# Patient Record
Sex: Female | Born: 1990 | Race: White | Hispanic: No | Marital: Single
Health system: Southern US, Community
[De-identification: ages and names within clinical notes are randomized; demographics above are authoritative.]

## PROBLEM LIST (undated history)

## (undated) ENCOUNTER — Emergency Department (HOSPITAL_COMMUNITY): Discharge status: Absent without leave | Payer: Commercial Managed Care - PPO

## (undated) DIAGNOSIS — F410 Panic disorder [episodic paroxysmal anxiety] without agoraphobia: Secondary | ICD-10-CM

---

## 2012-11-27 ENCOUNTER — Other Ambulatory Visit: Payer: Self-pay

## 2012-11-27 ENCOUNTER — Other Ambulatory Visit: Payer: Self-pay | Admitting: Gastroenterology

## 2012-11-27 ENCOUNTER — Ambulatory Visit
Admission: RE | Admit: 2012-11-27 | Discharge: 2012-11-27 | Disposition: A | Discharge status: Home / Self Care | Payer: Commercial Managed Care - PPO | Source: Ambulatory Visit | Attending: Gastroenterology | Admitting: Gastroenterology

## 2012-11-27 DIAGNOSIS — R1310 Dysphagia, unspecified: Secondary | ICD-10-CM

## 2013-05-11 ENCOUNTER — Inpatient Hospital Stay (HOSPITAL_COMMUNITY)
Admission: AD | Admit: 2013-05-11 | Discharge: 2013-05-11 | Discharge status: Against Medical Advice | Payer: Commercial Managed Care - PPO | Source: Ambulatory Visit | Attending: Obstetrics and Gynecology | Admitting: Obstetrics and Gynecology

## 2019-06-30 ENCOUNTER — Other Ambulatory Visit: Payer: Self-pay

## 2019-06-30 DIAGNOSIS — Z20822 Contact with and (suspected) exposure to covid-19: Secondary | ICD-10-CM

## 2019-07-02 LAB — NOVEL CORONAVIRUS, NAA: SARS-CoV-2, NAA: NOT DETECTED

## 2019-08-17 ENCOUNTER — Other Ambulatory Visit: Payer: Self-pay

## 2019-08-17 DIAGNOSIS — Z20822 Contact with and (suspected) exposure to covid-19: Secondary | ICD-10-CM

## 2019-08-18 LAB — NOVEL CORONAVIRUS, NAA: SARS-CoV-2, NAA: NOT DETECTED

## 2020-04-30 ENCOUNTER — Other Ambulatory Visit: Payer: Self-pay

## 2020-04-30 ENCOUNTER — Emergency Department (HOSPITAL_COMMUNITY)
Admission: EM | Admit: 2020-04-30 | Discharge: 2020-05-01 | Disposition: A | Discharge status: Home / Self Care | Payer: Self-pay | Attending: Emergency Medicine | Admitting: Emergency Medicine

## 2020-04-30 ENCOUNTER — Encounter (HOSPITAL_COMMUNITY): Payer: Self-pay | Admitting: Emergency Medicine

## 2020-04-30 ENCOUNTER — Emergency Department (HOSPITAL_COMMUNITY): Payer: Self-pay

## 2020-04-30 DIAGNOSIS — R071 Chest pain on breathing: Secondary | ICD-10-CM | POA: Insufficient documentation

## 2020-04-30 DIAGNOSIS — Z5321 Procedure and treatment not carried out due to patient leaving prior to being seen by health care provider: Secondary | ICD-10-CM | POA: Insufficient documentation

## 2020-04-30 DIAGNOSIS — M25512 Pain in left shoulder: Secondary | ICD-10-CM | POA: Insufficient documentation

## 2020-04-30 DIAGNOSIS — F410 Panic disorder [episodic paroxysmal anxiety] without agoraphobia: Secondary | ICD-10-CM | POA: Insufficient documentation

## 2020-04-30 HISTORY — DX: Panic disorder (episodic paroxysmal anxiety): F41.0

## 2020-04-30 LAB — CBC
HCT: 41.6 % (ref 36.0–46.0)
Hemoglobin: 13.6 g/dL (ref 12.0–15.0)
MCH: 29.3 pg (ref 26.0–34.0)
MCHC: 32.7 g/dL (ref 30.0–36.0)
MCV: 89.7 fL (ref 80.0–100.0)
Platelets: 332 10*3/uL (ref 150–400)
RBC: 4.64 MIL/uL (ref 3.87–5.11)
RDW: 12.7 % (ref 11.5–15.5)
WBC: 8 10*3/uL (ref 4.0–10.5)
nRBC: 0 % (ref 0.0–0.2)

## 2020-04-30 LAB — I-STAT BETA HCG BLOOD, ED (NOT ORDERABLE): I-stat hCG, quantitative: <5 m[IU]/mL (ref <5)

## 2020-04-30 LAB — BASIC METABOLIC PANEL
Anion gap: 8 (ref 5–15)
BUN: 10 mg/dL (ref 6–20)
CO2: 25 mmol/L (ref 22–32)
Calcium: 9 mg/dL (ref 8.9–10.3)
Chloride: 104 mmol/L (ref 98–111)
Creatinine, Ser: 0.58 mg/dL (ref 0.44–1.00)
GFR calc Af Amer: >60 mL/min (ref >60)
GFR calc non Af Amer: >60 mL/min (ref >60)
Glucose, Bld: 117 mg/dL — ABNORMAL HIGH (ref 70–99)
Potassium: 3.5 mmol/L (ref 3.5–5.1)
Sodium: 137 mmol/L (ref 135–145)

## 2020-04-30 LAB — TROPONIN I (HIGH SENSITIVITY): Troponin I (High Sensitivity): <2 ng/L (ref <18)

## 2020-04-30 NOTE — ED Triage Notes (Signed)
Patient reports constant left chest pain radiating to shoulder today. Reports hx of same and panic attacks. States pain worsens with inspiration.

## 2020-09-18 IMAGING — CR DG CHEST 2V
2 series · 2 of 2 positions shown · non-contrast
Comparison: None.

CLINICAL DATA: Chest pain

EXAM:
CHEST - 2 VIEW

[w chest pa]
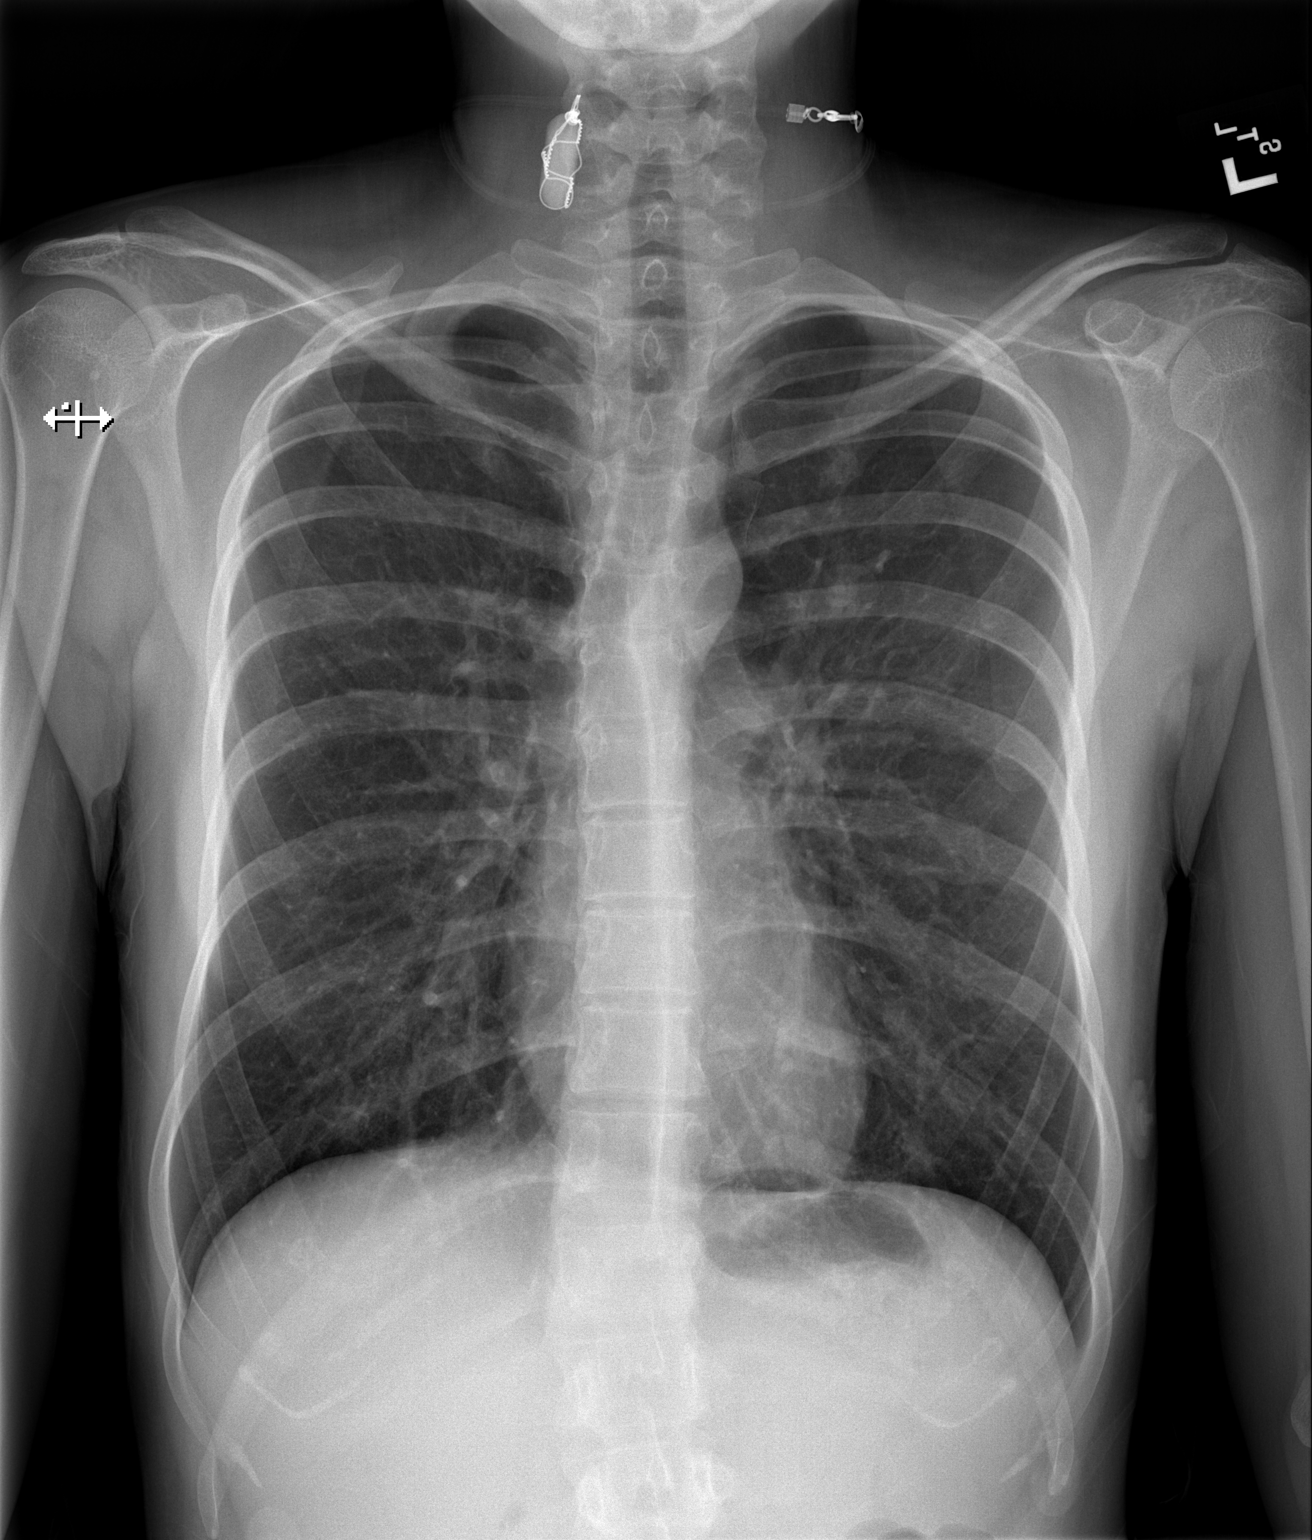

[w chest lat]
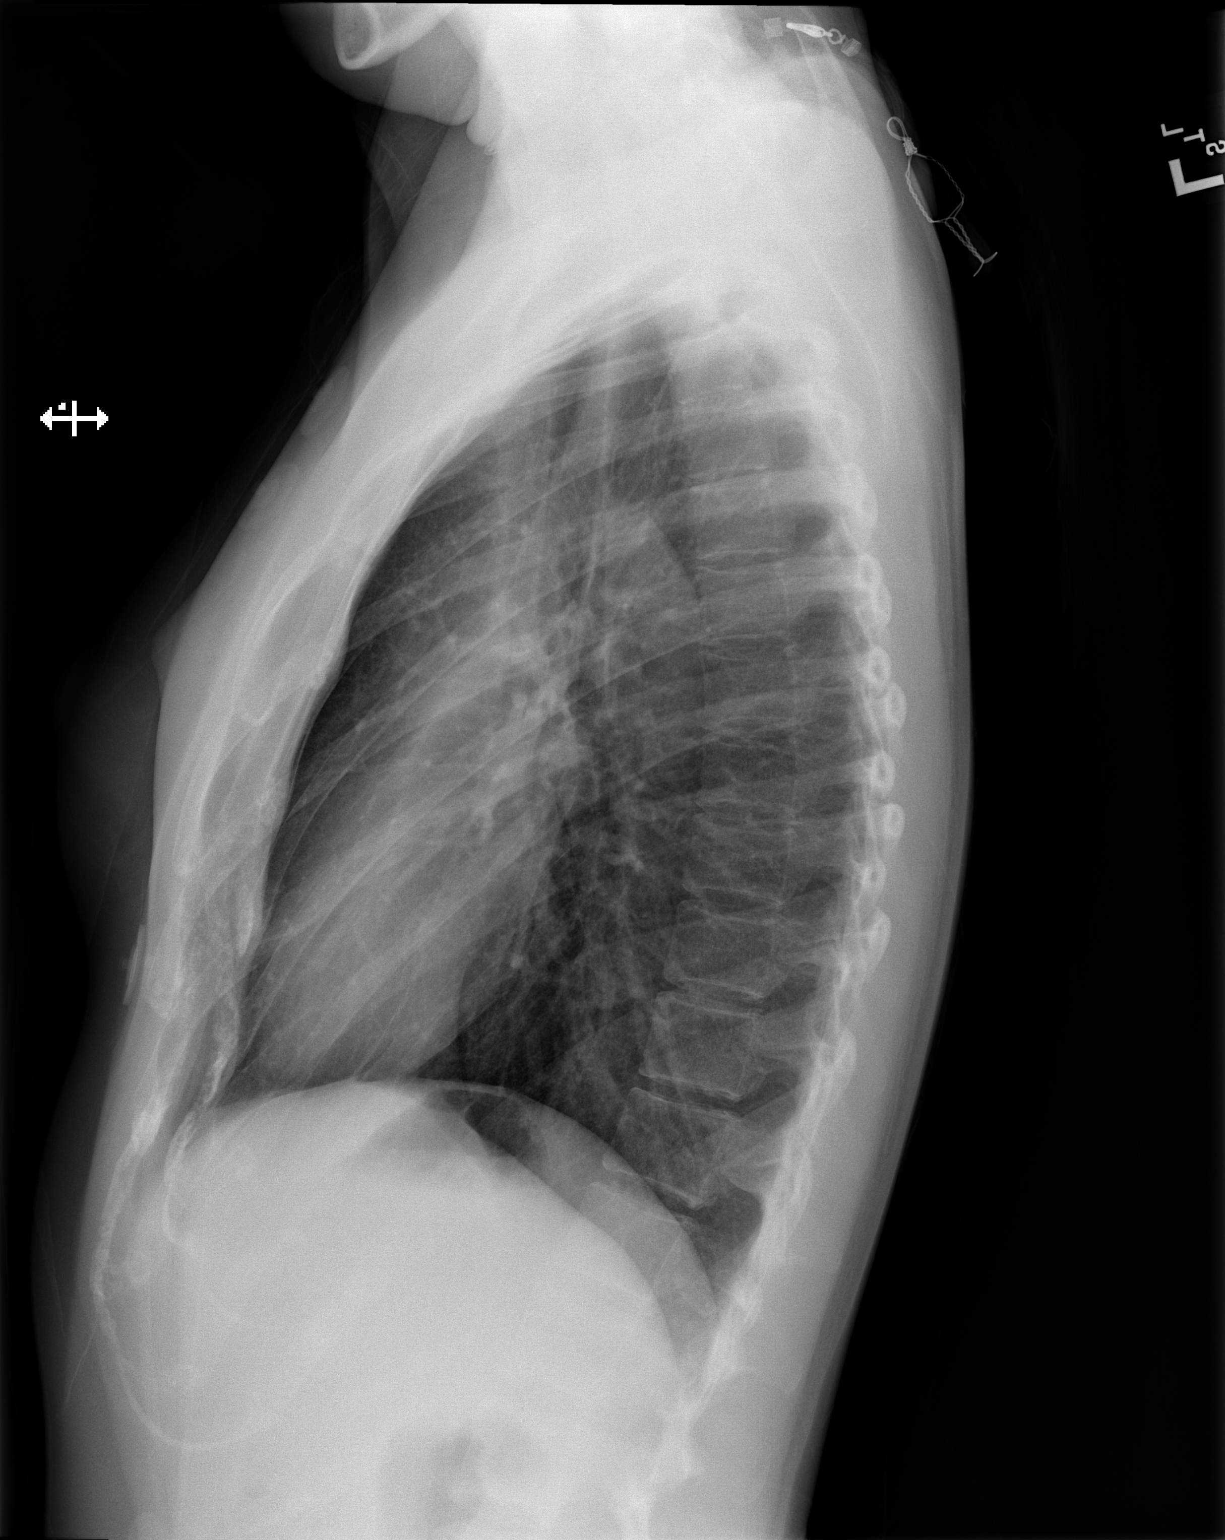

[2 of 2 positions shown; findings below may reference images not displayed]

FINDINGS: The heart size and mediastinal contours are within normal limits.
Both lungs are clear. The visualized skeletal structures are
unremarkable.
IMPRESSION: No active cardiopulmonary disease.
